# Patient Record
Sex: Female | Born: 1995 | Race: White | Hispanic: No | Marital: Single | Smoking: Never smoker
Health system: Southern US, Community
[De-identification: ages and names within clinical notes are randomized; demographics above are authoritative.]

## PROBLEM LIST (undated history)

## (undated) DIAGNOSIS — N83209 Unspecified ovarian cyst, unspecified side: Secondary | ICD-10-CM

---

## 2016-09-18 ENCOUNTER — Emergency Department (HOSPITAL_COMMUNITY)
Admission: EM | Admit: 2016-09-18 | Discharge: 2016-09-18 | Disposition: A | Discharge status: Home / Self Care | Payer: Self-pay | Attending: Emergency Medicine | Admitting: Emergency Medicine

## 2016-09-18 ENCOUNTER — Encounter (HOSPITAL_COMMUNITY): Payer: Self-pay

## 2016-09-18 DIAGNOSIS — R509 Fever, unspecified: Secondary | ICD-10-CM | POA: Insufficient documentation

## 2016-09-18 DIAGNOSIS — M79601 Pain in right arm: Secondary | ICD-10-CM | POA: Insufficient documentation

## 2016-09-18 DIAGNOSIS — Z5321 Procedure and treatment not carried out due to patient leaving prior to being seen by health care provider: Secondary | ICD-10-CM | POA: Insufficient documentation

## 2016-09-18 DIAGNOSIS — J029 Acute pharyngitis, unspecified: Secondary | ICD-10-CM | POA: Insufficient documentation

## 2016-09-18 DIAGNOSIS — R05 Cough: Secondary | ICD-10-CM | POA: Insufficient documentation

## 2016-09-18 NOTE — ED Triage Notes (Signed)
Onset 3 days right arm pain from weed eating.  Onset last night fever, chills, body aches, sore throat, cough.  Took Ibuprofen @ 3am.

## 2016-09-18 NOTE — ED Notes (Signed)
Called for vitals w no answer

## 2016-09-18 NOTE — ED Notes (Signed)
Called for vitals x2 

## 2016-11-26 ENCOUNTER — Emergency Department (HOSPITAL_COMMUNITY)
Admission: EM | Admit: 2016-11-26 | Discharge: 2016-11-27 | Disposition: A | Discharge status: Home / Self Care | Payer: Self-pay | Attending: Emergency Medicine | Admitting: Emergency Medicine

## 2016-11-26 ENCOUNTER — Encounter (HOSPITAL_COMMUNITY): Payer: Self-pay | Admitting: Emergency Medicine

## 2016-11-26 DIAGNOSIS — N10 Acute tubulo-interstitial nephritis: Secondary | ICD-10-CM | POA: Insufficient documentation

## 2016-11-26 DIAGNOSIS — Z79899 Other long term (current) drug therapy: Secondary | ICD-10-CM | POA: Insufficient documentation

## 2016-11-26 DIAGNOSIS — N23 Unspecified renal colic: Secondary | ICD-10-CM | POA: Insufficient documentation

## 2016-11-26 DIAGNOSIS — N12 Tubulo-interstitial nephritis, not specified as acute or chronic: Secondary | ICD-10-CM

## 2016-11-26 HISTORY — DX: Unspecified ovarian cyst, unspecified side: N83.209

## 2016-11-26 LAB — COMPREHENSIVE METABOLIC PANEL
ALBUMIN: 4.5 g/dL (ref 3.5–5.0)
ALK PHOS: 30 U/L — AB (ref 38–126)
ALT: 17 U/L (ref 14–54)
AST: 19 U/L (ref 15–41)
Anion gap: 9 (ref 5–15)
BUN: 18 mg/dL (ref 6–20)
CALCIUM: 9.2 mg/dL (ref 8.9–10.3)
CHLORIDE: 107 mmol/L (ref 101–111)
CO2: 22 mmol/L (ref 22–32)
CREATININE: 0.83 mg/dL (ref 0.44–1.00)
GFR calc non Af Amer: >60 mL/min (ref >60)
GLUCOSE: 121 mg/dL — AB (ref 65–99)
Potassium: 3.2 mmol/L — ABNORMAL LOW (ref 3.5–5.1)
SODIUM: 138 mmol/L (ref 135–145)
Total Bilirubin: 1.6 mg/dL — ABNORMAL HIGH (ref 0.3–1.2)
Total Protein: 7.7 g/dL (ref 6.5–8.1)

## 2016-11-26 LAB — URINALYSIS, ROUTINE W REFLEX MICROSCOPIC
BILIRUBIN URINE: NEGATIVE
Glucose, UA: NEGATIVE mg/dL
Ketones, ur: 80 mg/dL — AB
Leukocytes, UA: NEGATIVE
Nitrite: NEGATIVE
PH: 5 (ref 5.0–8.0)
Protein, ur: 30 mg/dL — AB
SPECIFIC GRAVITY, URINE: 1.03 (ref 1.005–1.030)

## 2016-11-26 LAB — CBC
HCT: 38.3 % (ref 36.0–46.0)
Hemoglobin: 13.1 g/dL (ref 12.0–15.0)
MCH: 29.2 pg (ref 26.0–34.0)
MCHC: 34.2 g/dL (ref 30.0–36.0)
MCV: 85.3 fL (ref 78.0–100.0)
PLATELETS: 208 10*3/uL (ref 150–400)
RBC: 4.49 MIL/uL (ref 3.87–5.11)
RDW: 12.3 % (ref 11.5–15.5)
WBC: 15.6 10*3/uL — ABNORMAL HIGH (ref 4.0–10.5)

## 2016-11-26 LAB — LIPASE, BLOOD: LIPASE: 29 U/L (ref 11–51)

## 2016-11-26 LAB — I-STAT BETA HCG BLOOD, ED (MC, WL, AP ONLY): I-stat hCG, quantitative: <5 m[IU]/mL (ref <5)

## 2016-11-26 MED ORDER — OXYCODONE-ACETAMINOPHEN 5-325 MG PO TABS
1.0000 | ORAL_TABLET | ORAL | Status: DC | PRN
  Administered 2016-11-26: 1 via ORAL
  Filled 2016-11-26: qty 1

## 2016-11-26 MED ORDER — MORPHINE SULFATE (PF) 4 MG/ML IV SOLN
4.0000 mg | Freq: Once | INTRAVENOUS | Status: AC
  Administered 2016-11-27: 4 mg via INTRAVENOUS
  Filled 2016-11-26: qty 1

## 2016-11-26 MED ORDER — SODIUM CHLORIDE 0.9 % IV BOLUS (SEPSIS)
1000.0000 mL | Freq: Once | INTRAVENOUS | Status: AC
  Administered 2016-11-27: 1000 mL via INTRAVENOUS

## 2016-11-26 MED ORDER — ONDANSETRON 4 MG PO TBDP
4.0000 mg | ORAL_TABLET | Freq: Once | ORAL | Status: AC | PRN
  Administered 2016-11-26: 4 mg via ORAL
  Filled 2016-11-26: qty 1

## 2016-11-26 MED ORDER — DEXTROSE 5 % IV SOLN
1.0000 g | Freq: Once | INTRAVENOUS | Status: AC
  Administered 2016-11-27: 1 g via INTRAVENOUS
  Filled 2016-11-26: qty 10

## 2016-11-26 NOTE — ED Notes (Signed)
Right flank radiating to back mid side

## 2016-11-26 NOTE — ED Provider Notes (Signed)
[facility]-EMERGENCY DEPT Provider Note   CSN: [id number] Arrival date & time: 11/26/16  2003     History   Chief Complaint Chief Complaint  Patient presents with  . Abdominal Pain    HPI Amber Mccullough is a 21 y.o. female presenting with right lower quadrant, right flank pain.  Patient has acute onset of right lower quadrant pain radiated to the right flank around 7 PM.  Patient noticed that her urine appears darker and has some dysuria as well.  Patient states that the pain is sharp.  Associated with some vomiting as well.  Patient has no history of kidney stones but both her mother and grandmother have kidney stones.  Patient denies any vaginal discharge or bleeding.   The history is provided by the patient.    Past Medical History:  Diagnosis Date  . Ovarian cyst     There are no active problems to display for this patient.   History reviewed. No pertinent surgical history.  OB History    No data available       Home Medications    Prior to Admission medications   Medication Sig Start Date End Date Taking? Authorizing Provider  acetaminophen (MIDOL) 650 MG CR tablet Take 650 mg by mouth every 8 (eight) hours as needed for pain (cramping).    Yes [provider]  ibuprofen (ADVIL,MOTRIN) 200 MG tablet Take 200 mg by mouth every 6 (six) hours as needed for headache, moderate pain or cramping.   Yes [provider]  naproxen sodium (ALEVE) 220 MG tablet Take 220 mg by mouth daily as needed (pain).    Yes [provider]    Family History No family history on file.  Social History Social History  Substance Use Topics  . Smoking status: Never Smoker  . Smokeless tobacco: Never Used  . Alcohol use No     Allergies   Patient has no known allergies.   Review of Systems Review of Systems  Gastrointestinal: Positive for abdominal pain.  All other systems reviewed and are negative.    Physical Exam Updated  Vital Signs BP 129/76   Pulse 96   Temp 98 F (36.7 C) (Oral)   Resp 16   LMP 10/24/2016 Comment: HCG < 5 11-26-16  SpO2 98%   Physical Exam  Constitutional: She is oriented to person, place, and time.  Uncomfortable   HENT:  Head: Normocephalic.  Eyes: Pupils are equal, round, and reactive to light. Conjunctivae and EOM are normal.  Neck: Normal range of motion. Neck supple.  Cardiovascular: Normal rate, regular rhythm and normal heart sounds.   Pulmonary/Chest: Effort normal and breath sounds normal. No respiratory distress. She has no wheezes. She has no rales.  Abdominal: Soft.  + RLQ tenderness, + R CVAT   Musculoskeletal: Normal range of motion.  Neurological: She is alert and oriented to person, place, and time. No cranial nerve deficit. Coordination normal.  Skin: Skin is warm.  Psychiatric: She has a normal mood and affect.  Nursing note and vitals reviewed.    ED Treatments / Results  Labs (all labs ordered are listed, but only abnormal results are displayed) Labs Reviewed  COMPREHENSIVE METABOLIC PANEL - Abnormal; Notable for the following:       Result Value   Potassium 3.2 (*)    Glucose, Bld 121 (*)    Alkaline Phosphatase 30 (*)    Total Bilirubin 1.6 (*)    All other components within normal  limits  CBC - Abnormal; Notable for the following:    WBC 15.6 (*)    All other components within normal limits  URINALYSIS, ROUTINE W REFLEX MICROSCOPIC - Abnormal; Notable for the following:    APPearance HAZY (*)    Hgb urine dipstick MODERATE (*)    Ketones, ur 80 (*)    Protein, ur 30 (*)    Bacteria, UA RARE (*)    Squamous Epithelial / LPF 6-30 (*)    All other components within normal limits  URINE CULTURE  LIPASE, BLOOD  I-STAT BETA HCG BLOOD, ED ([facility], [facility], [facility] ONLY)    EKG  EKG Interpretation None       Radiology Ct Renal Stone Study  Result Date: 11/27/2016 CLINICAL DATA:  Right flank/ lower quadrant pain for 1 hour. EXAM: CT ABDOMEN AND  PELVIS WITHOUT CONTRAST TECHNIQUE: Multidetector CT imaging of the abdomen and pelvis was performed following the standard protocol without IV contrast. COMPARISON:  None. FINDINGS: Lower chest: The lung bases are clear. Hepatobiliary: No focal lesion allowing for lack contrast. Gallbladder physiologically distended, no calcified stone. No biliary dilatation. Pancreas: No ductal dilatation or inflammation. Spleen: Normal in size without focal abnormality. Adrenals/Urinary Tract: Normal adrenal glands. Mild right hydronephrosis with moderate perinephric edema. The ureter is decompressed. No urolithiasis. Urinary bladder is nondistended without stone. No left hydronephrosis or perinephric edema. Stomach/Bowel: Stomach is nondistended. No bowel inflammation, wall thickening or obstruction. The appendix not confidently visualized. No pericecal inflammation. Small to moderate colonic stool burden without colonic wall thickening. Vascular/Lymphatic: Normal caliber abdominal aorta. No bulky adenopathy. Reproductive: Uterus and bilateral adnexa are unremarkable. Small amount of pelvic free fluid, likely physiologic. Other: No free air or upper abdominal ascites. No intra-abdominal abscess. Musculoskeletal: There are no acute or suspicious osseous abnormalities. IMPRESSION: Right hydronephrosis and perinephric edema. No stone or cause for obstruction visualized. Findings may be due to recently passed stone, non radiopaque stone, or urinary tract infection. Electronically Signed   By: Rubye Oaks M.D.   On: 11/27/2016 01:33    Procedures Procedures (including critical care time)  Medications Ordered in ED Medications  oxyCODONE-acetaminophen (PERCOCET/ROXICET) 5-325 MG per tablet 1 tablet (1 tablet Oral Given 11/26/16 2055)  ondansetron (ZOFRAN-ODT) disintegrating tablet 4 mg (4 mg Oral Given 11/26/16 2055)  sodium chloride 0.9 % bolus 1,000 mL (1,000 mLs Intravenous New Bag/Given 11/27/16 0032)  cefTRIAXone  (ROCEPHIN) 1 g in dextrose 5 % 50 mL IVPB (1 g Intravenous New Bag/Given 11/27/16 0032)  morphine 4 MG/ML injection 4 mg (4 mg Intravenous Given 11/27/16 0032)     Initial Impression / Assessment and Plan / ED Course  I have reviewed the triage vital signs and the nursing notes.  Pertinent labs & imaging results that were available during my care of the patient were reviewed by me and considered in my medical decision making (see chart for details).     Amber Mccullough is a 21 y.o. female here with RLQ and R flank pain, dysuria. Likely pyelo vs renal colic. Will get labs, CT renal stone, UA. Will hydrate and give pain meds and reassess.   2:05 AM WBC 15. UA showed too many to count WBC. CT renal stone showed no stone but there is R hydro and perinephric edema. I think likely recently passed stone vs pyelo. Given rocephin. Tachycardia improved. Doesn't appear septic. Will dc home with keflex, motrin, vicodin.   Final Clinical Impressions(s) / ED Diagnoses   Final diagnoses:  None  New Prescriptions New Prescriptions   No medications on file     Charlynne Pander, MD 11/27/16 [phone]

## 2016-11-26 NOTE — ED Triage Notes (Signed)
Patient c/o RLQ pain about an hour ago. Pt appears in a lot of pain. 1 episode of emesis. Patient now having difficulty urinating. Pain radiating into back.

## 2016-11-27 ENCOUNTER — Emergency Department (HOSPITAL_COMMUNITY): Payer: Self-pay

## 2016-11-27 MED ORDER — CEPHALEXIN 500 MG PO CAPS: 500.0000 mg | ORAL_CAPSULE | Freq: Three times a day (TID) | ORAL | 0 refills | Status: AC

## 2016-11-27 MED ORDER — HYDROCODONE-ACETAMINOPHEN 5-325 MG PO TABS: 1.0000 | ORAL_TABLET | Freq: Four times a day (QID) | ORAL | 0 refills | Status: AC | PRN

## 2016-11-27 MED ORDER — IBUPROFEN 600 MG PO TABS: 600.0000 mg | ORAL_TABLET | Freq: Four times a day (QID) | ORAL | 0 refills | Status: AC | PRN

## 2016-11-27 NOTE — Discharge Instructions (Signed)
Take keflex for kidney infection.   Take motrin for pain,.   Take norco for severe pain. Do NOT drive with it.   See your doctor. Consider following up with urology   Return to ER if you have worse flank pain, vomiting, fever.

## 2016-11-28 LAB — URINE CULTURE: Culture: NO GROWTH

## 2017-12-04 IMAGING — CT CT RENAL STONE PROTOCOL
2 of 3 series · 16 of 46 positions shown, 18 images · non-contrast
Comparison: None.

CLINICAL DATA: Right flank/ lower quadrant pain for 1 hour.

EXAM:
CT ABDOMEN AND PELVIS WITHOUT CONTRAST
TECHNIQUE: Multidetector CT imaging of the abdomen and pelvis was performed
following the standard protocol without IV contrast.

[Series 4: lung · axial · 0.74mm/px · z∈[-217,-101]mm · 13 of 68 slices shown, 15 images]
[im 5/68  soft-tissue]
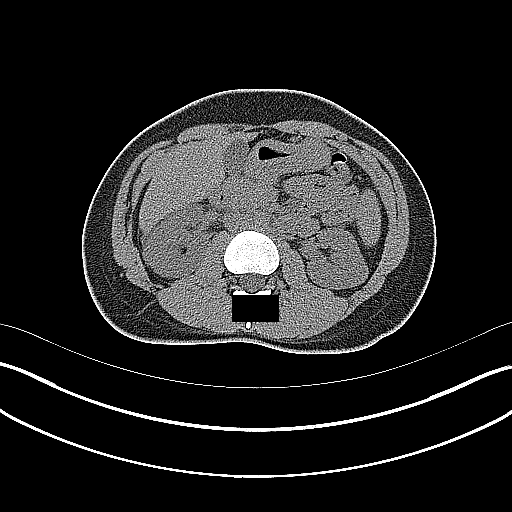
[im 5/68  bone]
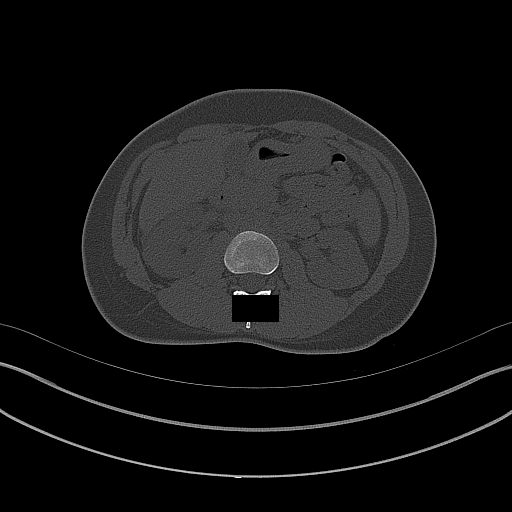
[im 9/68  soft-tissue]
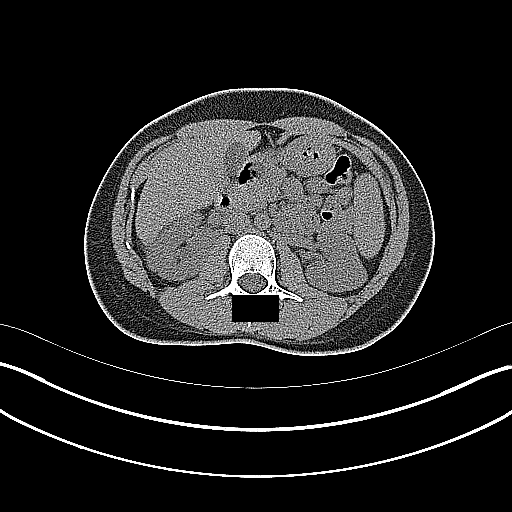
[im 13/68  soft-tissue]
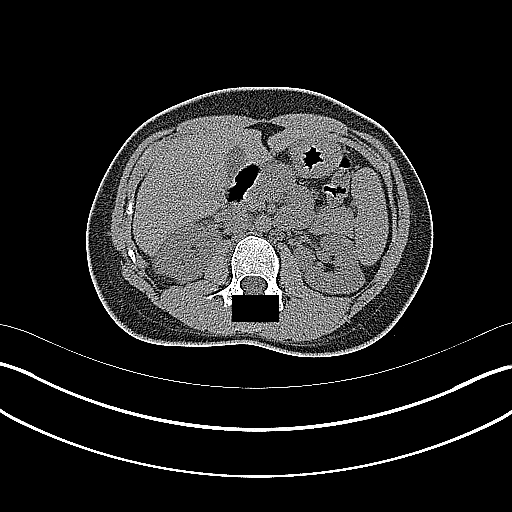
[im 20/68  soft-tissue]
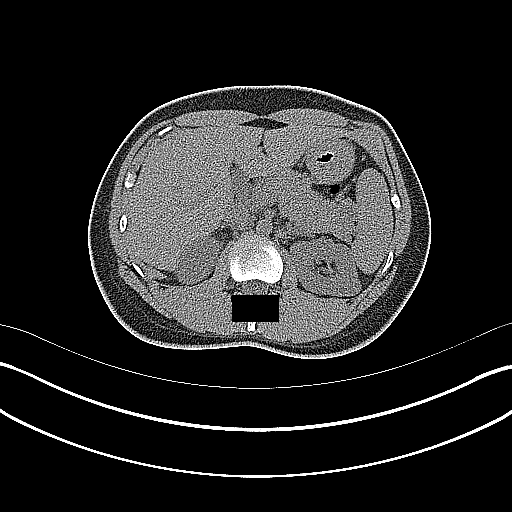
[im 24/68  soft-tissue]
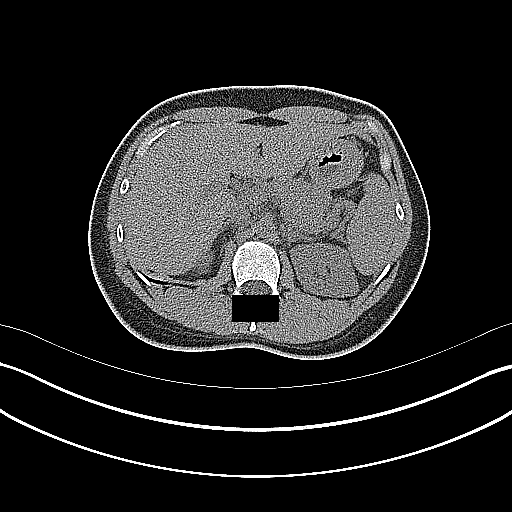
[im 29/68  soft-tissue]
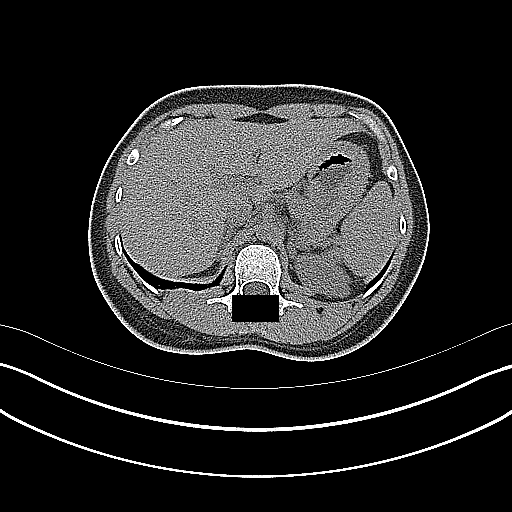
[im 35/68  soft-tissue]
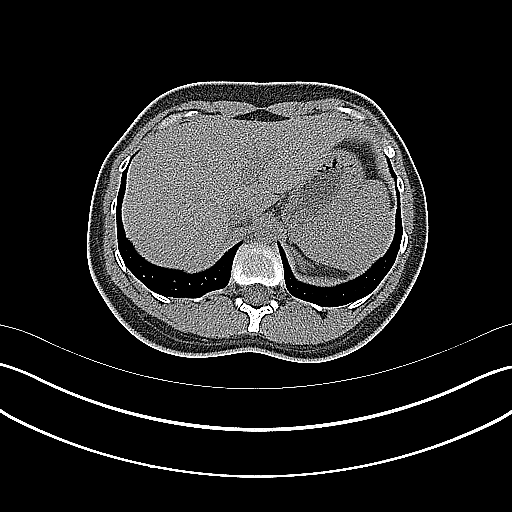
[im 39/68  soft-tissue]
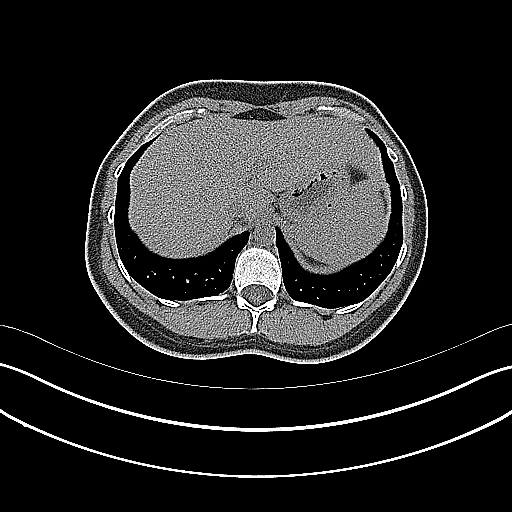
[im 44/68  soft-tissue]
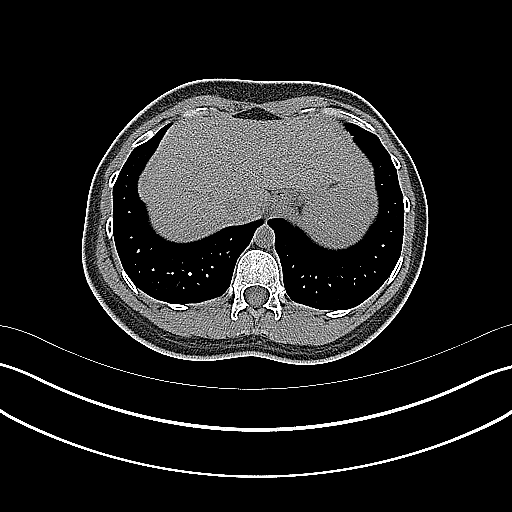
[im 44/68  bone]
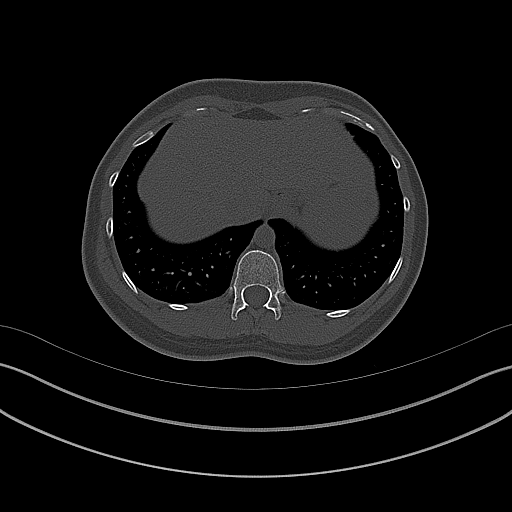
[im 48/68  soft-tissue]
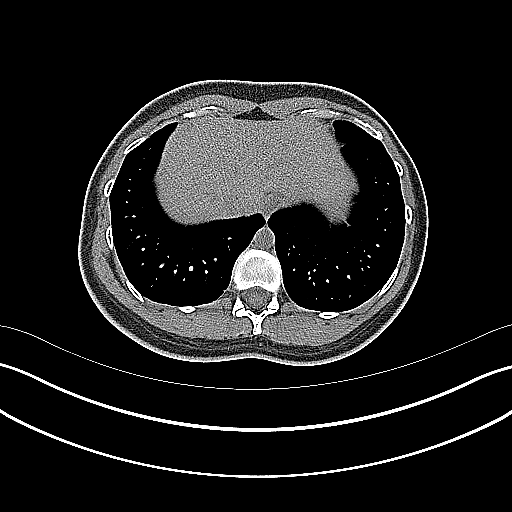
[im 55/68  soft-tissue]
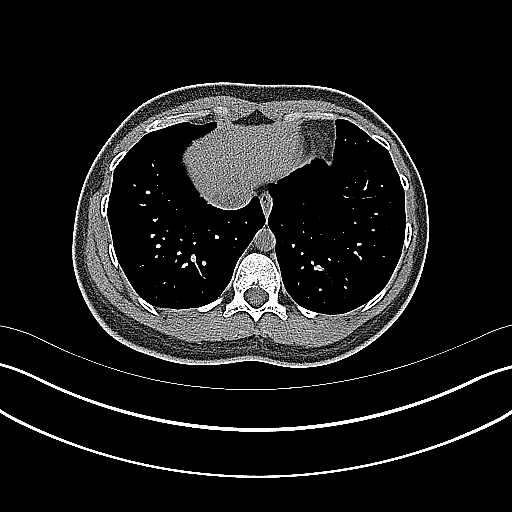
[im 59/68  soft-tissue]
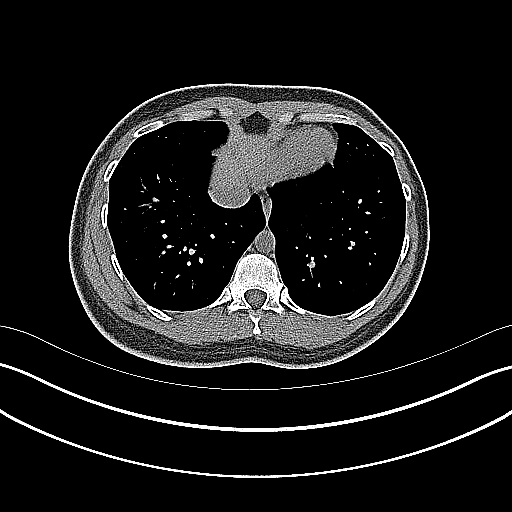
[im 63/68  soft-tissue]
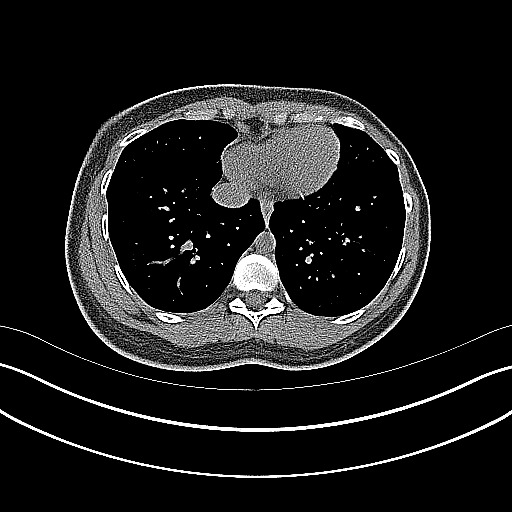

[Series 5: coronal · coronal · 0.64mm/px · 3 of 111 slices shown]
[im 37/111  soft-tissue]
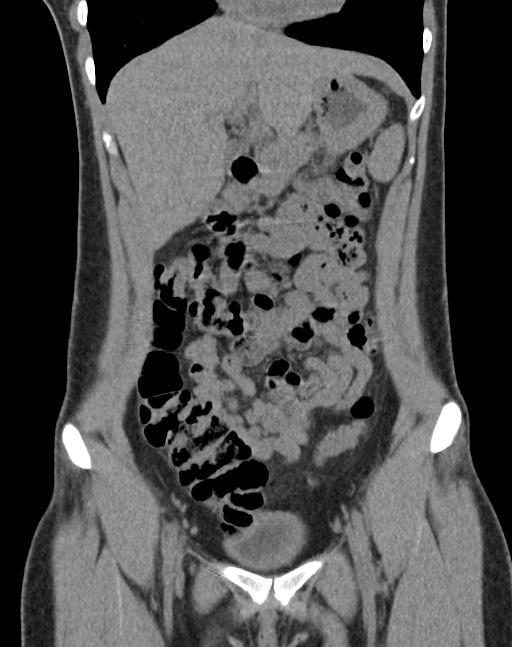
[im 49/111  soft-tissue]
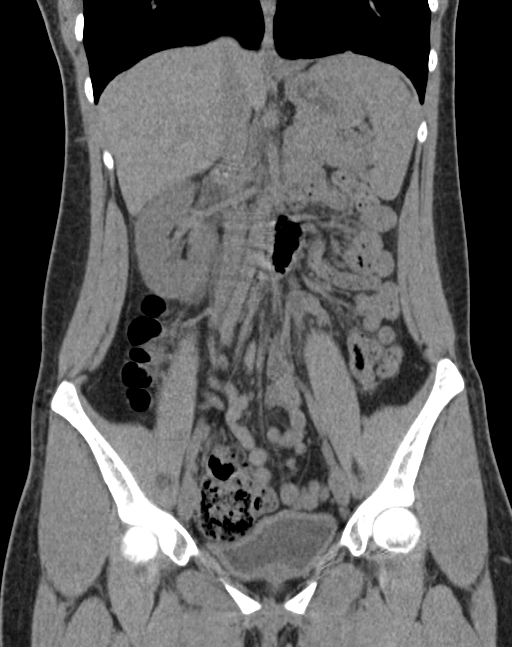
[im 62/111  soft-tissue]
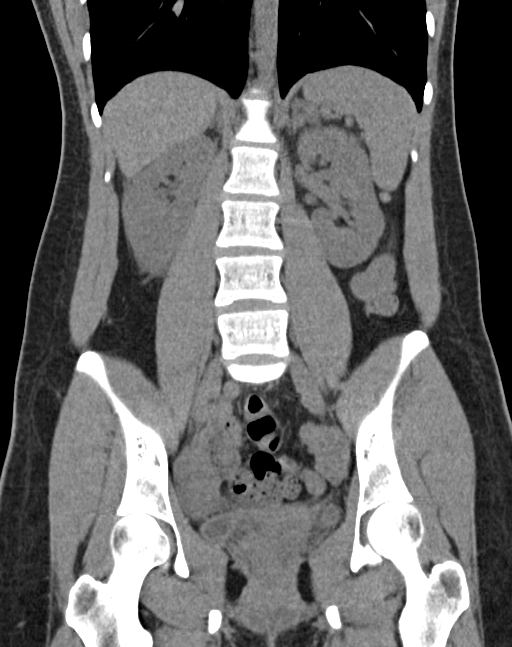

[16 of 46 positions shown; findings below may reference images not displayed]

FINDINGS: Lower chest: The lung bases are clear.

Hepatobiliary: No focal lesion allowing for lack contrast.
Gallbladder physiologically distended, no calcified stone. No
biliary dilatation.

Pancreas: No ductal dilatation or inflammation.

Spleen: Normal in size without focal abnormality.

Adrenals/Urinary Tract: Normal adrenal glands. Mild right
hydronephrosis with moderate perinephric edema. The ureter is
decompressed. No urolithiasis. Urinary bladder is nondistended
without stone. No left hydronephrosis or perinephric edema.

Stomach/Bowel: Stomach is nondistended. No bowel inflammation, wall
thickening or obstruction. The appendix not confidently visualized.
No pericecal inflammation. Small to moderate colonic stool burden
without colonic wall thickening.

Vascular/Lymphatic: Normal caliber abdominal aorta. No bulky
adenopathy.

Reproductive: Uterus and bilateral adnexa are unremarkable. Small
amount of pelvic free fluid, likely physiologic.

Other: No free air or upper abdominal ascites. No intra-abdominal
abscess.

Musculoskeletal: There are no acute or suspicious osseous
abnormalities.
IMPRESSION: Right hydronephrosis and perinephric edema. No stone or cause for
obstruction visualized. Findings may be due to recently passed
stone, non radiopaque stone, or urinary tract infection.
